# Patient Record
Sex: Female | Born: 1955 | Race: White | Hispanic: No | Marital: Married
Health system: Southern US, Community
[De-identification: ages and names within clinical notes are randomized; demographics above are authoritative.]

---

## 2010-04-25 ENCOUNTER — Ambulatory Visit
Admission: RE | Admit: 2010-04-25 | Discharge: 2010-04-25 | Payer: Self-pay | Source: Home / Self Care | Admitting: Gynecologic Oncology

## 2010-06-17 LAB — COMPREHENSIVE METABOLIC PANEL
BUN: 8 mg/dL (ref 6–23)
CO2: 26 mEq/L (ref 19–32)
Calcium: 9.6 mg/dL (ref 8.4–10.5)
Creatinine, Ser: 0.81 mg/dL (ref 0.4–1.2)
GFR calc non Af Amer: >60 mL/min (ref >60)
Glucose, Bld: 94 mg/dL (ref 70–99)
Total Protein: 8.4 g/dL — ABNORMAL HIGH (ref 6.0–8.3)

## 2010-06-17 LAB — DIFFERENTIAL
Basophils Absolute: 0.1 10*3/uL (ref 0.0–0.1)
Eosinophils Relative: 5 % (ref 0–5)
Lymphocytes Relative: 25 % (ref 12–46)
Monocytes Absolute: 0.5 10*3/uL (ref 0.1–1.0)
Monocytes Relative: 4 % (ref 3–12)
Neutro Abs: 8.1 10*3/uL — ABNORMAL HIGH (ref 1.7–7.7)

## 2010-06-17 LAB — CBC
HCT: 44.6 % (ref 36.0–46.0)
Hemoglobin: 14.8 g/dL (ref 12.0–15.0)
MCH: 27.4 pg (ref 26.0–34.0)
MCHC: 33.2 g/dL (ref 30.0–36.0)
RDW: 13.8 % (ref 11.5–15.5)

## 2010-06-17 LAB — SURGICAL PCR SCREEN: Staphylococcus aureus: POSITIVE — AB

## 2010-06-18 ENCOUNTER — Encounter: Payer: Self-pay | Admitting: Obstetrics & Gynecology

## 2010-06-18 ENCOUNTER — Ambulatory Visit (HOSPITAL_COMMUNITY)
Admission: RE | Admit: 2010-06-18 | Discharge: 2010-06-19 | Payer: Self-pay | Source: Home / Self Care | Attending: Obstetrics & Gynecology | Admitting: Obstetrics & Gynecology

## 2010-06-18 NOTE — Consult Note (Signed)
NAMEKRYSTI, [name] NO.:  [id number]  MEDICAL RECORD NO.:  [id number]           PATIENT TYPE:  LOCATION:                                 FACILITY:  PHYSICIAN:  Laurette Schimke, MD     DATE OF BIRTH:  [birth year]  DATE OF CONSULTATION:  06/18/2010 DATE OF DISCHARGE:                                CONSULTATION   REASON FOR SURGERY:  Endometrial cancer staging.  HISTORY OF PRESENT ILLNESS:  This is a 55 year old, para 1, with last normal menstrual period greater than 4 years ago.  Uterine bleeding was noted in August of 2011, and thus she presented with that complaint to her primary care practitioner on February 01, 2010.  She was ultimately evaluated by Dr. Senaida Ores.  An ultrasound demonstrated a homogeneous pattern with the uterine stripe of 15 mm.  Endometrial biopsy was consistent with a well-differentiated grade 1 endometrial cancer.  PAST MEDICAL HISTORY:  Low back pain for approximately 20 years.  PAST SURGICAL HISTORY:  Denies.  ALLERGIES:  No known drug allergies.  PAST GYNECOLOGICAL HISTORY:  Menarche at age 38 with regular menses until approximately 4 years ago.  No history of abnormal pap test.  SCREENING HISTORY:  Denies mammography or colonoscopy.  FAMILY HISTORY:  Father with bone cancer diagnosed in his 85s. Otherwise, no GYN or colon malignancies.  REVIEW OF SYSTEMS:  A 10-point review of systems is notable for easy fatigability with short distance of ambulation.  The patient becomes winded with climb of 1 flight of stairs.  There is no nausea associated with chest the pain.  The patient denies diaphoresis, radiation of pain. There has been no weight loss.  She denies low back pain.  She denies any changes in her bladder or rectal function.  MEDICATIONS:  Denies.  PHYSICAL EXAMINATION:  GENERAL APPEARANCE:  Well-developed female in no acute distress. HEENT:  Normocephalic, atraumatic.  Sclerae nonicteric.  Thinning of frontal  hair. LYMPH NODES:  No cervical, supraclavicular, or inguinal adenopathy. CHEST:  Clear to auscultation. BACK:  No CVA tenderness. EXTREMITIES:  No clubbing, cyanosis, or edema. HEART:  Regular rate and rhythm. ABDOMINOPELVIC:  Obese, soft.  No palpable omental masses.  No palpable pelvic masses, nontender.  No guarding.  Normal external genitalia, Bartholin's, urethral and Skene's glands.  No lesions.  No metastatic disease is noted within the vaginal vault or on the cervix. RECTAL:  Good anal sphincter without any masses.  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 The patient was counseled regarding the options and has opted for surgical staging.  Prior to surgical staging, medical clearance was requested by the Cardiopulmonary Services.  The patient was assessed by Dr. Baldo Daub.  The patient underwent intravenous Lexiscan per protocol.  There were nondiagnostic EKG changes without arrhythmias. The Cardiolite images did not reveal any evidence of ischemia.  The resting heart rate was 63 with blood pressure of 160/100.  There was normal ejection fraction.  The proposed procedure is a robotic-assisted laparoscopic hysterectomy, bilateral salpingo-oophorectomy and bilateral pelvic lymph node dissection as indicated by frozen section.  The risks  of this procedure were discussed with the patient, were inclusive of infection, bleeding, damage to surrounding structures, prolonged hospitalization and reoperation.  All of her questions were answered to her satisfaction.  The proposed procedure will occur on June 18, 2010.     Laurette Schimke, MD  CC Telford Nab, RN    Lester Cuyuna, MD    Roney Marion, MD   WB/MEDQ  D:  06/13/2010  T:  06/13/2010  Job:  [id number]   Electronically Signed by Laurette Schimke MD on 06/18/2010 11:11:47 AM

## 2010-06-19 LAB — CBC
HCT: 37 % (ref 36.0–46.0)
MCHC: 32.4 g/dL (ref 30.0–36.0)
Platelets: 242 10*3/uL (ref 150–400)
RDW: 13.9 % (ref 11.5–15.5)
WBC: 16.9 10*3/uL — ABNORMAL HIGH (ref 4.0–10.5)

## 2010-06-19 LAB — TYPE AND SCREEN
ABO/RH(D): O POS
Antibody Screen: NEGATIVE

## 2010-06-19 LAB — ABO/RH: ABO/RH(D): O POS

## 2010-06-20 NOTE — Op Note (Addendum)
NAMEANYE, BROSE                 ACCOUNT NO.:  [id number]  MEDICAL RECORD NO.:  [id number]          PATIENT TYPE:  AMB  LOCATION:  DAY                          FACILITY:  [facility]  PHYSICIAN:  Laurette Schimke, MD     DATE OF BIRTH:  1955-09-28  DATE OF PROCEDURE:  06/18/2010 DATE OF DISCHARGE:                              OPERATIVE REPORT   PREOPERATIVE DIAGNOSIS:  Grade 1 endometrial cancer.  POSTOPERATIVE DIAGNOSIS:  Stage 1A grade 1 endometrial adenocarcinoma.  PROCEDURE:  Robotic laparoscopic hysterectomy, bilateral salpingo- oophorectomy.  ANESTHESIA:  General endotracheal.  SURGEON:  Laurette Schimke, MD  ASSISTANT:  Antionette Char, MD and Telford Nab, RN  FINDINGS:  On examination of the abdomen, adnexa appeared within normal limits.  No evidence of metastatic disease was appreciated.  Uterus was approximately 8 cm.  DESCRIPTION OF PROCEDURE:  The patient was taken to the operating room, placed under general endotracheal anesthesia without any difficulty. She was prepped and draped and medium KOH ring placed over the cervix in addition to uterine manipulator, and then entry was made in the left upper quadrant 2 cm inferior to the ribcage and the abdomen insufflated. Trocars were placed just superior to the umbilicus and then 10 cm lateral to the umbilicus, ports were inserted and the robot docked.  The right round ligament was transected and retroperitoneal space was entered.  The ureter was identified.  Infundibulopelvic ligament was skeletonized, cauterized, and transected.  The bladder was dissected off the lower uterine segment and upper vagina.  The posterior peritoneum of the uterus was also reflected off.  The uterine artery was isolated, fulgurated and transected.  In the contralateral fashion, the filamentous adhesions of the left colon were dissected from the sidewall.  The left round ligament transected, retroperitoneal space entered, ureter  identified.  The infundibulopelvic ligament skeletonized.  Large venous sinus was appreciated on this side and care was taken to perform dissection around the structure.  The vessels were ligated and then transected.  The bladder flap was further dissected and posterior retroperitoneum dissected off the posterior aspect of the cervix.  The vaginal balloon was insufflated and the colpotomy was made. The specimen was then removed from the pelvis.  The abdomen was copiously irrigated and drained.  The vaginal cuff was closed with 0 Vicryl suture.  Hemostasis was assured.  Frozen section returned with evidence of minimal, if any, myometrial invasion and just small amount of residual disease.  Surgical sites were inspected.  Hemostasis was assured.  The ports were removed under direct visualization after removal of the suture.  The laparoscopic port sites were closed and subcutaneous tissue was approximated and skin closed with running subcuticular suture.  Sponges, instruments, and needle count correct x3.  SPECIMENS:  Washings, uterus, cervix, ovaries, and tubes.  DISPOSITION:  The patient was extubated and taken to recovery room in stable condition.     Laurette Schimke, MD     WB/MEDQ  D:  06/18/2010  T:  06/18/2010  Job:  [id number]  cc:   Roseanna Rainbow, M.D. Fax: [phone]  Telford Nab, R.N. [address] [facility]  [place], [place] [address]  Alison Murray Fax: [phone]  Roney Marion, MD [facility]  Electronically Signed by Laurette Schimke MD on 06/20/2010 11:23:02 AM

## 2010-08-08 ENCOUNTER — Ambulatory Visit: Payer: Self-pay | Attending: Gynecologic Oncology | Admitting: Gynecologic Oncology

## 2010-08-08 DIAGNOSIS — Z09 Encounter for follow-up examination after completed treatment for conditions other than malignant neoplasm: Secondary | ICD-10-CM | POA: Insufficient documentation

## 2010-08-08 DIAGNOSIS — C549 Malignant neoplasm of corpus uteri, unspecified: Secondary | ICD-10-CM | POA: Insufficient documentation

## 2010-08-16 NOTE — Consult Note (Signed)
  [name], [name]                 ACCOUNT NO.:  [id number]  MEDICAL RECORD NO.:  [id number]           PATIENT TYPE:  LOCATION:                                 FACILITY:  PHYSICIAN:  Laurette Schimke, MD     DATE OF BIRTH:  Mar 17, 1956  DATE OF CONSULTATION:  08/08/2010 DATE OF DISCHARGE:                                CONSULTATION   REASON FOR VISIT:  Postoperative visit status post endometrial cancer staging.  HISTORY OF PRESENT ILLNESS:  This is a 55 year old who had episodes of uterine bleeding dating back to August 2011.  Dr. Senaida Ores began evaluation in October 2011, at which time pelvic ultrasound was notable for a uterine stripe of 15 mm and an endometrial biopsy was performed and consistent with a mucinous adenocarcinoma of the endometrium, well- differentiated, grade 1.  On June 18 2010, she underwent robotic hysterectomy and bilateral salpingo-oophorectomy.  Frozen section diagnosis was consistent with a grade 1 endometrial cancer with minimal myometrial invasion.  Final pathology was notable for myometrial invasion of 2 mm.  There was no lymphovascular space invasion and no evidence of metastatic disease.  As such, she has a stage IA grade 1 endometrioid adenocarcinoma.  Ms. Province states that she has been doing well and is anxious to return to her pool exercises.  There is no nausea, vomiting, fever, chills, diarrhea, vaginal discharge or vaginal bleeding.  PHYSICAL EXAMINATION:  GENERAL:  A well-developed female in no acute distress. VITAL SIGNS:  Weight 192 pounds, blood pressure 122/74, pulse was 68. CHEST:  Clear to auscultation. HEART:  Regular rate and rhythm. ABDOMEN:  Soft, nontender.  No evidence of hernia at the port sites.  No erythema. PELVIC EXAMINATION:  Normal external genitalia, Bartholin's, urethra and Skene's.  Vaginal cuff intact but the vagina is markedly atrophic.  No cul-de-sac masses or fluid collections were palpable.  IMPRESSION:   Stage IA grade 1 endometrioid endometrial adenocarcinoma.  PLAN:  I have asked Ms. Heinzman to follow up with Dr. Senaida Ores in 6 months and follow up with Gynecology/Oncology service in 1 year.  The routine will be a visual inspection of the vaginal vault with bimanual examinations every 6 months.  Imaging or other biomarker survey will only be indicated based on symptomatology.  She had questions regarding returning to function and returning to her usual activities and her husband's questions were answered to their satisfaction.     Laurette Schimke, MD     WB/MEDQ  D:  08/08/2010  T:  08/08/2010  Job:  [id number]  cc:   Alison Murray Fax: [phone]  Telford Nab, R.N. [phone] [address] [place], [place] [address]  Roney Marion, M.D. Rosalita Levan, [place]  Electronically Signed by Laurette Schimke MD on 08/12/2010 10:35:21 AM

## 2012-05-21 IMAGING — CR DG CHEST 2V
2 series · 2 of 2 positions shown · non-contrast
Comparison: None.

CLINICAL DATA: Preoperative film for patient endometrial carcinoma.

CHEST - 2 VIEW

[w chest pa]
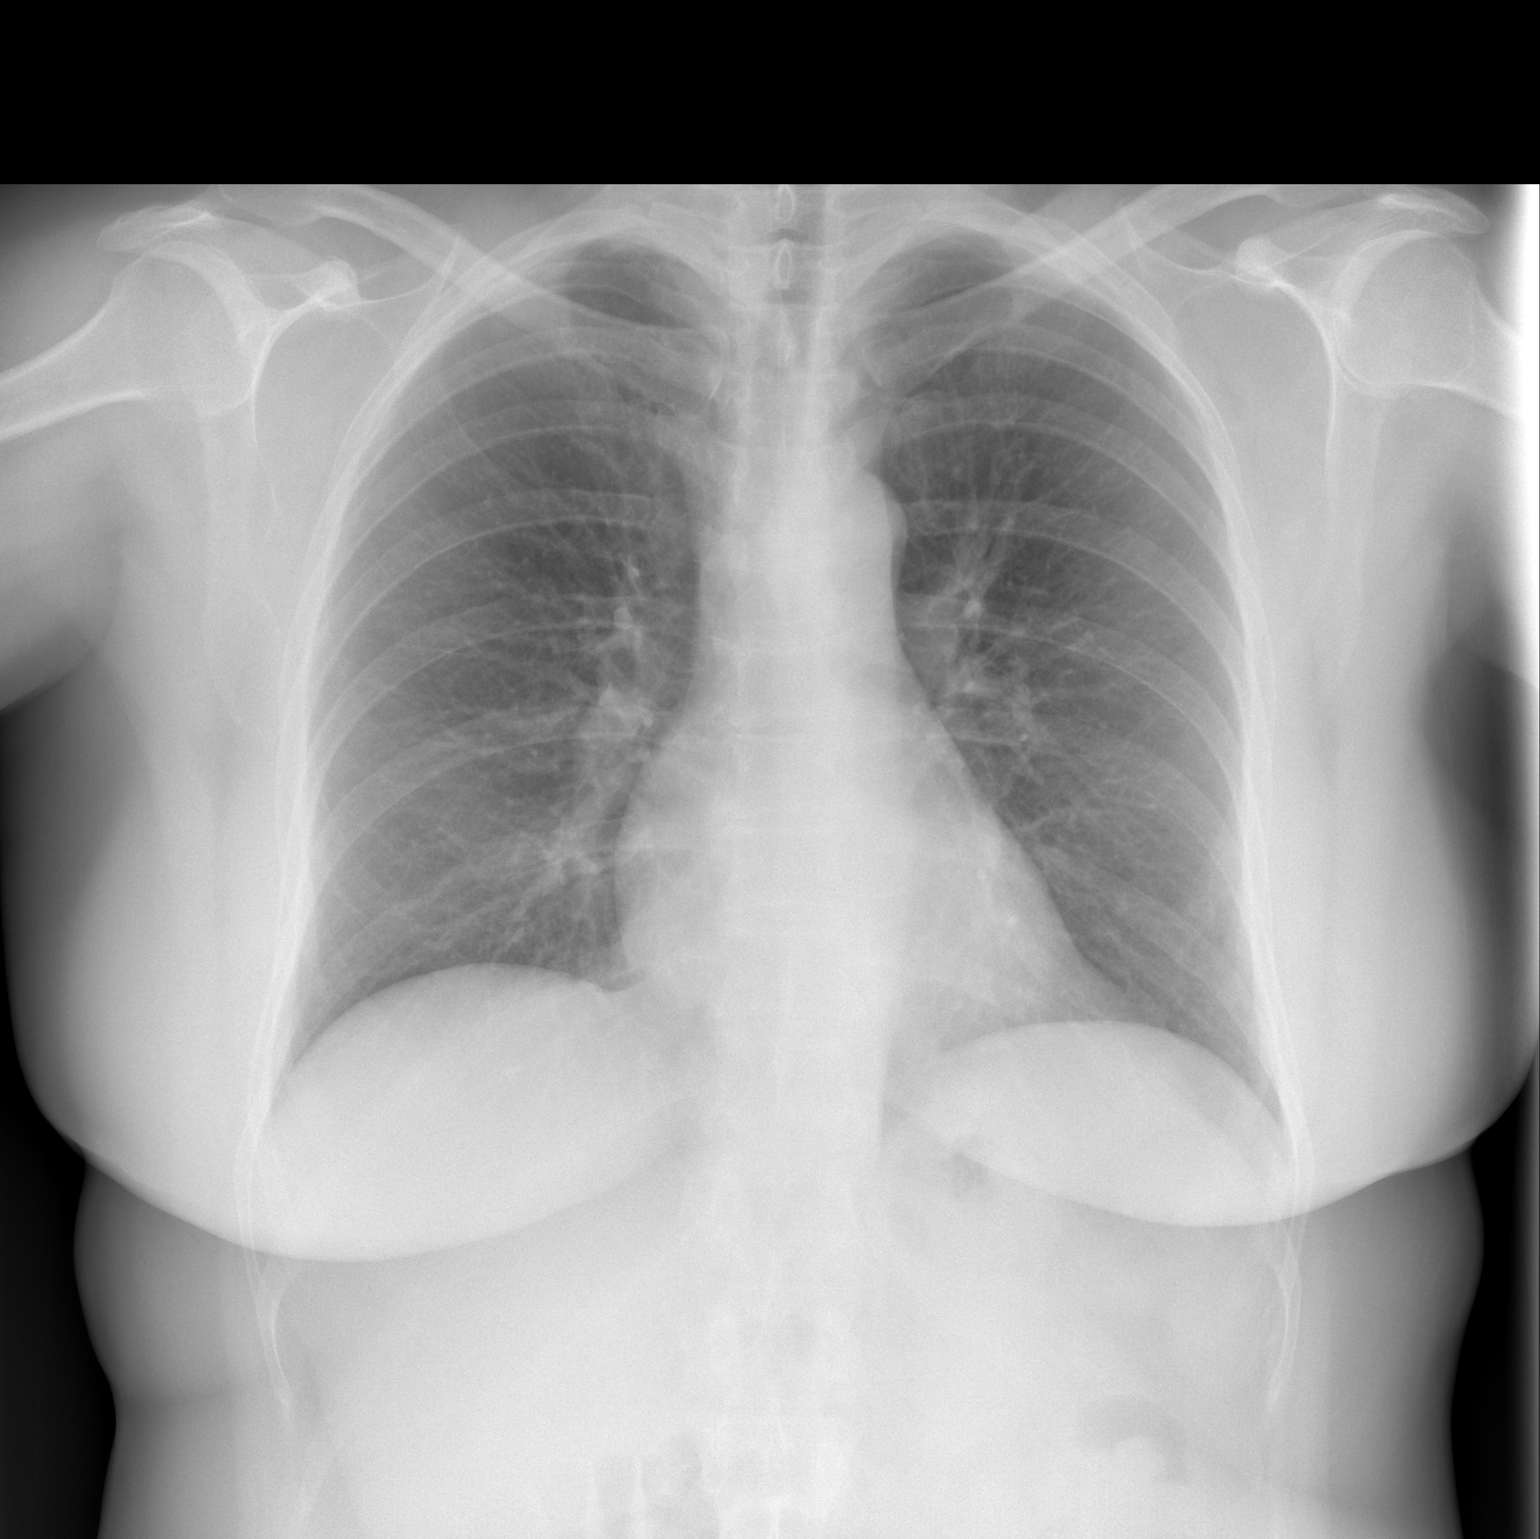

[w chest lat]
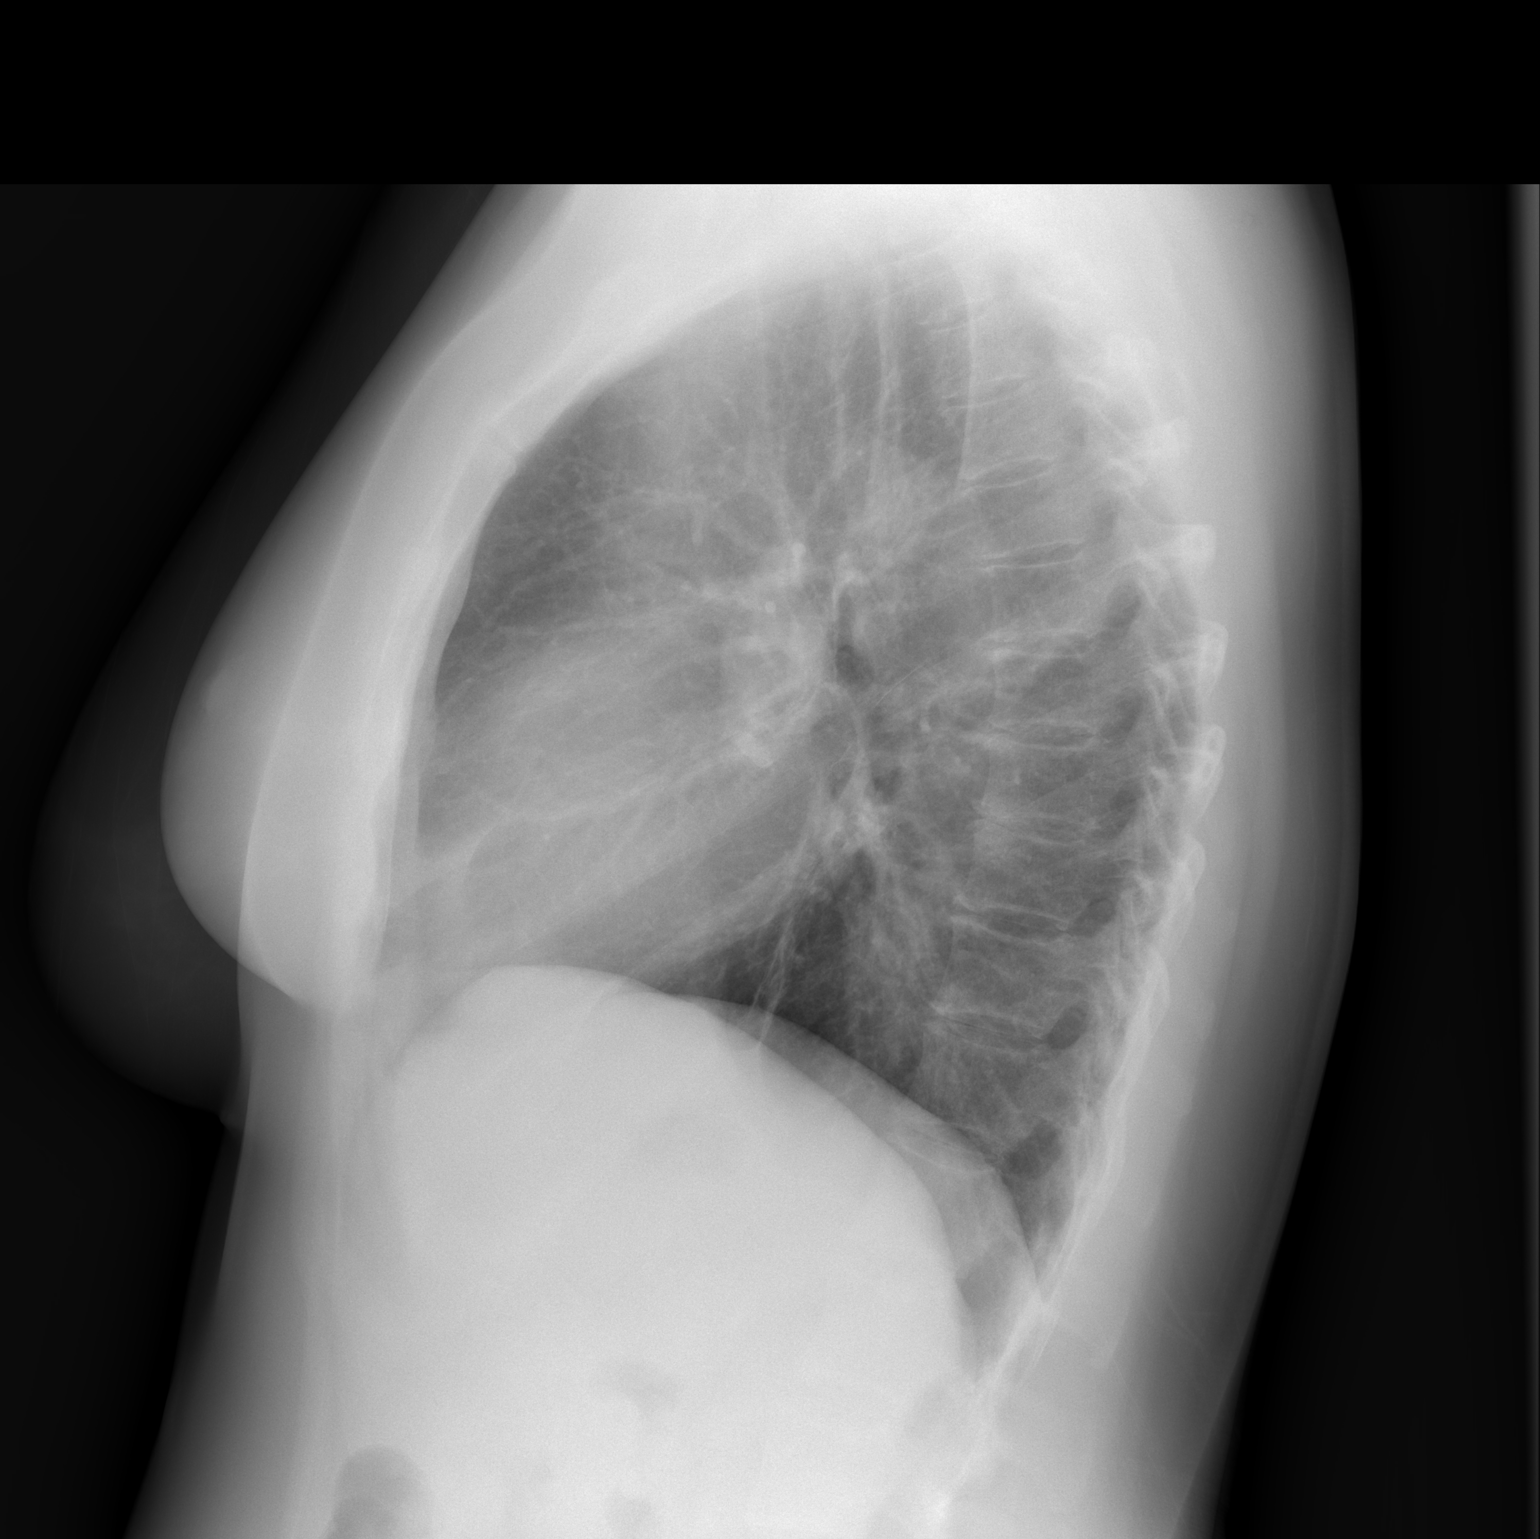

[2 of 2 positions shown; findings below may reference images not displayed]

FINDINGS: Lungs are clear.  No pneumothorax or pleural effusion.
Heart size is normal.  No focal bony abnormality.
IMPRESSION: Negative chest.

## 2018-09-01 DIAGNOSIS — Z0000 Encounter for general adult medical examination without abnormal findings: Secondary | ICD-10-CM | POA: Diagnosis not present

## 2018-09-13 DIAGNOSIS — Z1212 Encounter for screening for malignant neoplasm of rectum: Secondary | ICD-10-CM | POA: Diagnosis not present

## 2018-09-13 DIAGNOSIS — Z1211 Encounter for screening for malignant neoplasm of colon: Secondary | ICD-10-CM | POA: Diagnosis not present

## 2018-09-16 DIAGNOSIS — Z79899 Other long term (current) drug therapy: Secondary | ICD-10-CM | POA: Diagnosis not present

## 2018-09-16 DIAGNOSIS — H6120 Impacted cerumen, unspecified ear: Secondary | ICD-10-CM | POA: Diagnosis not present

## 2018-09-16 DIAGNOSIS — R300 Dysuria: Secondary | ICD-10-CM | POA: Diagnosis not present

## 2018-09-16 DIAGNOSIS — Z6832 Body mass index (BMI) 32.0-32.9, adult: Secondary | ICD-10-CM | POA: Diagnosis not present

## 2018-09-16 DIAGNOSIS — I10 Essential (primary) hypertension: Secondary | ICD-10-CM | POA: Diagnosis not present

## 2018-09-24 DIAGNOSIS — Z780 Asymptomatic menopausal state: Secondary | ICD-10-CM | POA: Diagnosis not present

## 2018-09-24 DIAGNOSIS — M810 Age-related osteoporosis without current pathological fracture: Secondary | ICD-10-CM | POA: Diagnosis not present

## 2018-11-18 DIAGNOSIS — Z0100 Encounter for examination of eyes and vision without abnormal findings: Secondary | ICD-10-CM | POA: Diagnosis not present

## 2018-11-18 DIAGNOSIS — H524 Presbyopia: Secondary | ICD-10-CM | POA: Diagnosis not present

## 2020-02-13 DIAGNOSIS — Z6832 Body mass index (BMI) 32.0-32.9, adult: Secondary | ICD-10-CM | POA: Diagnosis not present

## 2020-02-13 DIAGNOSIS — Z79899 Other long term (current) drug therapy: Secondary | ICD-10-CM | POA: Diagnosis not present

## 2020-02-13 DIAGNOSIS — Z6836 Body mass index (BMI) 36.0-36.9, adult: Secondary | ICD-10-CM | POA: Diagnosis not present

## 2020-02-13 DIAGNOSIS — Z0000 Encounter for general adult medical examination without abnormal findings: Secondary | ICD-10-CM | POA: Diagnosis not present

## 2020-02-13 DIAGNOSIS — Z23 Encounter for immunization: Secondary | ICD-10-CM | POA: Diagnosis not present

## 2020-02-13 DIAGNOSIS — I10 Essential (primary) hypertension: Secondary | ICD-10-CM | POA: Diagnosis not present

## 2020-02-13 DIAGNOSIS — E785 Hyperlipidemia, unspecified: Secondary | ICD-10-CM | POA: Diagnosis not present

## 2020-02-13 DIAGNOSIS — E782 Mixed hyperlipidemia: Secondary | ICD-10-CM | POA: Diagnosis not present

## 2020-02-13 DIAGNOSIS — Z1331 Encounter for screening for depression: Secondary | ICD-10-CM | POA: Diagnosis not present

## 2020-02-21 ENCOUNTER — Ambulatory Visit (INDEPENDENT_AMBULATORY_CARE_PROVIDER_SITE_OTHER): Payer: Medicare HMO | Admitting: Sports Medicine

## 2020-02-21 ENCOUNTER — Other Ambulatory Visit: Payer: Self-pay | Admitting: Sports Medicine

## 2020-02-21 ENCOUNTER — Other Ambulatory Visit: Payer: Self-pay

## 2020-02-21 ENCOUNTER — Ambulatory Visit (INDEPENDENT_AMBULATORY_CARE_PROVIDER_SITE_OTHER): Payer: Medicare HMO

## 2020-02-21 ENCOUNTER — Encounter: Payer: Self-pay | Admitting: Sports Medicine

## 2020-02-21 DIAGNOSIS — M79671 Pain in right foot: Secondary | ICD-10-CM | POA: Diagnosis not present

## 2020-02-21 DIAGNOSIS — M216X2 Other acquired deformities of left foot: Secondary | ICD-10-CM | POA: Diagnosis not present

## 2020-02-21 DIAGNOSIS — M216X1 Other acquired deformities of right foot: Secondary | ICD-10-CM | POA: Diagnosis not present

## 2020-02-21 DIAGNOSIS — M79672 Pain in left foot: Secondary | ICD-10-CM | POA: Diagnosis not present

## 2020-02-21 DIAGNOSIS — M722 Plantar fascial fibromatosis: Secondary | ICD-10-CM

## 2020-02-21 DIAGNOSIS — M779 Enthesopathy, unspecified: Secondary | ICD-10-CM

## 2020-02-21 MED ORDER — MELOXICAM 15 MG PO TABS: 15.0000 mg | ORAL_TABLET | Freq: Every day | ORAL | 0 refills | Status: AC

## 2020-02-21 NOTE — Progress Notes (Signed)
Subjective: [name] is a 64 y.o. female patient presents to office with complaint of moderate heel pain on the left>right. Patient admits to post static dyskinesia for 2 months. Patient has treated this problem with motrin 200mg  and brace with no relief. Denies any other pedal complaints.   Review of Systems  All other systems reviewed and are negative.    There are no problems to display for this patient.   Current Outpatient Medications on File Prior to Visit  Medication Sig Dispense Refill  . Ibuprofen (MOTRIN PO) Take by mouth.     No current facility-administered medications on file prior to visit.    No Known Allergies  Objective: Physical Exam General: The patient is alert and oriented x3 in no acute distress.  Dermatology: Skin is warm, dry and supple bilateral lower extremities. Nails 1-10 are normal. There is no erythema, edema, no eccymosis, no open lesions present. Integument is otherwise unremarkable.  Vascular: Dorsalis Pedis pulse and Posterior Tibial pulse are 1/4 bilateral. Capillary fill time is immediate to all digits.  Neurological: Grossly intact to light touch  bilateral.  Musculoskeletal: Tenderness to palpation at the medial calcaneal tubercale and through the insertion of the plantar fascia on the left>right foot. No pain with compression of calcaneus bilateral. No pain with tuning fork to calcaneus bilateral. No pain with calf compression bilateral. There is decreased Ankle joint range of motion bilateral. All other joints range of motion within normal limits bilateral. Strength 5/5 in all groups bilateral.   Gait: Unassisted, Antalgic avoid weight on heels  Xray, Left foot:  Normal osseous mineralization. Joint spaces preserved. No fracture/dislocation/boney destruction. Calcaneal spur present with mild thickening of plantar fascia. No other soft tissue abnormalities or radiopaque foreign bodies.   Assessment and Plan: Problem List Items Addressed  This Visit    None    Visit Diagnoses    Plantar fasciitis of left foot    -  Primary   Relevant Orders   DG Foot Complete Left   Plantar fasciitis of right foot       Left foot pain       Right foot pain       Acquired equinus deformity of both feet          -Complete examination performed.  -Xrays reviewed -Discussed with patient in detail the condition of plantar fasciitis, how this occurs and general treatment options. Explained both conservative and surgical treatments.  -Patient declined PO steroid at this visit  -Rx Meloxicam -Recommended good supportive shoes and advised refrain from walking barefoot -Continue with current OTC brace -Explained and dispensed to patient daily stretching exercises. -Recommend patient to ice affected area 1-2x daily. -Patient to return to office in 3 weeks for follow up or sooner if problems or questions arise.  Landis Martins, DPM

## 2020-02-21 NOTE — Patient Instructions (Signed)
Stretch every morning as below Do not walk around bare foot Ice each heel with frozen water bottle for 7mins a day in the evening   Plantar Fasciitis Rehab Ask your health care provider which exercises are safe for you. Do exercises exactly as told by your health care provider and adjust them as directed. It is normal to feel mild stretching, pulling, tightness, or discomfort as you do these exercises. Stop right away if you feel sudden pain or your pain gets worse. Do not begin these exercises until told by your health care provider. Stretching and range-of-motion exercises These exercises warm up your muscles and joints and improve the movement and flexibility of your foot. These exercises also help to relieve pain. Plantar fascia stretch  1. Sit with your left / right leg crossed over your opposite knee. 2. Hold your heel with one hand with that thumb near your arch. With your other hand, hold your toes and gently pull them back toward the top of your foot. You should feel a stretch on the bottom of your toes or your foot (plantar fascia) or both. 3. Hold this stretch for 5 econds. 4. Slowly release your toes and return to the starting position. Repeat ___30____ times. Complete this exercise ____once______ times a day. Gastrocnemius stretch, standing This exercise is also called a calf (gastroc) stretch. It stretches the muscles in the back of the upper calf. 1. Stand with your hands against a wall. 2. Extend your left / right leg behind you, and bend your front knee slightly. 3. Keeping your heels on the floor and your back knee straight, shift your weight toward the wall. Do not arch your back. You should feel a gentle stretch in your upper left / right calf. 4. Hold this position for __________ seconds. Repeat __________ times. Complete this exercise __________ times a day. Soleus stretch, standing This exercise is also called a calf (soleus) stretch. It stretches the muscles in the  back of the lower calf. 1. Stand with your hands against a wall. 2. Extend your left / right leg behind you, and bend your front knee slightly. 3. Keeping your heels on the floor, bend your back knee and shift your weight slightly over your back leg. You should feel a gentle stretch deep in your lower calf. 4. Hold this position for __________ seconds. Repeat __________ times. Complete this exercise __________ times a day. Gastroc and soleus stretch, standing step This exercise stretches the muscles in the back of the lower leg. These muscles are in the upper calf (gastrocnemius) and the lower calf (soleus). 1. Stand with the ball of your left / right foot on a step. The ball of your foot is on the walking surface, right under your toes. 2. Keep your other foot firmly on the same step. 3. Hold on to the wall or a railing for balance. 4. Slowly lift your other foot, allowing your body weight to press your left / right heel down over the edge of the step. You should feel a stretch in your left / right calf. 5. Hold this position for __________ seconds. 6. Return both feet to the step. 7. Repeat this exercise with a slight bend in your left / right knee. Repeat __________ times with your left / right knee straight and __________ times with your left / right knee bent. Complete this exercise __________ times a day. Balance exercise This exercise builds your balance and strength control of your arch to help take pressure  off your plantar fascia. Single leg stand If this exercise is too easy, you can try it with your eyes closed or while standing on a pillow. 1. Without shoes, stand near a railing or in a doorway. You may hold on to the railing or door frame as needed. 2. Stand on your left / right foot. Keep your big toe down on the floor and try to keep your arch lifted. Do not let your foot roll inward. 3. Hold this position for __________ seconds. Repeat __________ times. Complete this exercise  __________ times a day. This information is not intended to replace advice given to you by your health care provider. Make sure you discuss any questions you have with your health care provider. Document Revised: 09/02/2018 Document Reviewed: 03/10/2018 Elsevier Patient Education  [place].

## 2020-03-01 DIAGNOSIS — Z0100 Encounter for examination of eyes and vision without abnormal findings: Secondary | ICD-10-CM | POA: Diagnosis not present

## 2020-03-06 DIAGNOSIS — H524 Presbyopia: Secondary | ICD-10-CM | POA: Diagnosis not present

## 2020-03-06 DIAGNOSIS — Z0100 Encounter for examination of eyes and vision without abnormal findings: Secondary | ICD-10-CM | POA: Diagnosis not present

## 2020-03-13 ENCOUNTER — Ambulatory Visit (INDEPENDENT_AMBULATORY_CARE_PROVIDER_SITE_OTHER): Payer: Medicare HMO | Admitting: Sports Medicine

## 2020-03-13 ENCOUNTER — Other Ambulatory Visit: Payer: Self-pay

## 2020-03-13 ENCOUNTER — Encounter: Payer: Self-pay | Admitting: Sports Medicine

## 2020-03-13 DIAGNOSIS — M216X2 Other acquired deformities of left foot: Secondary | ICD-10-CM

## 2020-03-13 DIAGNOSIS — M722 Plantar fascial fibromatosis: Secondary | ICD-10-CM

## 2020-03-13 DIAGNOSIS — M79672 Pain in left foot: Secondary | ICD-10-CM | POA: Diagnosis not present

## 2020-03-13 DIAGNOSIS — M216X1 Other acquired deformities of right foot: Secondary | ICD-10-CM

## 2020-03-13 DIAGNOSIS — M79671 Pain in right foot: Secondary | ICD-10-CM

## 2020-03-13 MED ORDER — DICLOFENAC SODIUM 1 % EX GEL: 4.0000 g | Freq: Four times a day (QID) | CUTANEOUS | 1 refills | Status: AC

## 2020-03-13 NOTE — Patient Instructions (Addendum)
You may start using Voltaren cream/gel on next week if you still have foot pain once you have finished Mobic (pills by mouth)  For tennis shoes recommend:  [place] Can be purchased at [employer] or [employer] arch fit Can be purchased at any major retailers  Vionic  SAS Can be purchased at [employer] or [employer]   For work shoes recommend: [employer] Work [employer] Can be purchased at a variety of places or Engineer, maintenance (IT)   For casual shoes recommend: Vionic  Can be purchased at [employer] or [place] recommend: Power Steps Can be purchased in office/[employer] and [place] Can be purchased at [employer] or [employer] Can be purchased at [employer]

## 2020-03-13 NOTE — Progress Notes (Signed)
Subjective: [name] is a 64 y.o. female returns to office for follow up evaluation of heel pain right.  Patient reports her heels are feeling better had a little bit of twitching in her toes on the right for one episode when she started the meloxicam but otherwise is doing much better.  Patient reports that icing and gentle stretching helps but had to stop stretching when she crosses her legs because it was aggravating her knee.  Patient denies any other pedal complaints at this time.  There are no problems to display for this patient.   Current Outpatient Medications on File Prior to Visit  Medication Sig Dispense Refill  . Ibuprofen (MOTRIN PO) Take by mouth.    . meloxicam (MOBIC) 15 MG tablet Take 1 tablet (15 mg total) by mouth daily. 30 tablet 0   No current facility-administered medications on file prior to visit.    No Known Allergies  Objective:   General:  Alert and oriented x 3, in no acute distress  Dermatology: Skin is warm, dry, and supple bilateral. Nails are within normal limits. There is no lower extremity erythema, no eccymosis, no open lesions present bilateral.   Vascular: Dorsalis Pedis and Posterior Tibial pedal pulses are 1/4 bilateral. + hair growth noted bilateral. Capillary Fill Time is 3 seconds in all digits. No varicosities, No edema bilateral lower extremities.   Neurological: Sensation grossly intact to light touch bilateral.  Musculoskeletal: There is decreased tenderness to palpation at the medial calcaneal tubercale and through the insertion of the plantar fascia on the left foot.  No pain with compression to calcaneus or application of tuning fork. There is decreased Ankle joint range of motion bilateral. All other jointsrange of motion  within normal limits bilateral. Strength 5/5 bilateral.   Assessment and Plan: Problem List Items Addressed This Visit    None    Visit Diagnoses    Plantar fasciitis of left foot    -  Primary   Plantar  fasciitis of right foot       Left foot pain       Right foot pain       Acquired equinus deformity of both feet          -Complete examination performed.  -Previous x-rays reviewed. -Re-Discussed with patient in detail the condition of plantar fasciitis, how this occurs related to the foot type of the patient and general treatment options. - Patient declines steroid at this time -Prescribed Voltaren gel to use as instructed -Continue with meloxicam until completed -Continue with stretching, icing, good supportive shoes, and heel lifts as dispensed daily  -Discussed long term care and reocurrence; will closely monitor; if fails to improve will consider other treatment modalities.  -Patient to call if symptoms fail to improve after 1 month.  Landis Martins, DPM

## 2021-02-15 DIAGNOSIS — M8580 Other specified disorders of bone density and structure, unspecified site: Secondary | ICD-10-CM | POA: Diagnosis not present

## 2021-02-15 DIAGNOSIS — Z0000 Encounter for general adult medical examination without abnormal findings: Secondary | ICD-10-CM | POA: Diagnosis not present

## 2021-02-15 DIAGNOSIS — Z23 Encounter for immunization: Secondary | ICD-10-CM | POA: Diagnosis not present

## 2021-02-15 DIAGNOSIS — Z6835 Body mass index (BMI) 35.0-35.9, adult: Secondary | ICD-10-CM | POA: Diagnosis not present

## 2021-02-15 DIAGNOSIS — Z2821 Immunization not carried out because of patient refusal: Secondary | ICD-10-CM | POA: Diagnosis not present

## 2021-02-15 DIAGNOSIS — E785 Hyperlipidemia, unspecified: Secondary | ICD-10-CM | POA: Diagnosis not present

## 2021-02-15 DIAGNOSIS — Z1331 Encounter for screening for depression: Secondary | ICD-10-CM | POA: Diagnosis not present

## 2021-03-14 DIAGNOSIS — Z0100 Encounter for examination of eyes and vision without abnormal findings: Secondary | ICD-10-CM | POA: Diagnosis not present

## 2021-03-14 DIAGNOSIS — H524 Presbyopia: Secondary | ICD-10-CM | POA: Diagnosis not present

## 2022-02-07 DIAGNOSIS — M8580 Other specified disorders of bone density and structure, unspecified site: Secondary | ICD-10-CM | POA: Diagnosis not present

## 2022-02-07 DIAGNOSIS — E785 Hyperlipidemia, unspecified: Secondary | ICD-10-CM | POA: Diagnosis not present

## 2022-02-17 DIAGNOSIS — Z0000 Encounter for general adult medical examination without abnormal findings: Secondary | ICD-10-CM | POA: Diagnosis not present

## 2022-02-17 DIAGNOSIS — E785 Hyperlipidemia, unspecified: Secondary | ICD-10-CM | POA: Diagnosis not present

## 2022-02-17 DIAGNOSIS — M8580 Other specified disorders of bone density and structure, unspecified site: Secondary | ICD-10-CM | POA: Diagnosis not present

## 2022-02-25 DIAGNOSIS — Z1211 Encounter for screening for malignant neoplasm of colon: Secondary | ICD-10-CM | POA: Diagnosis not present

## 2022-02-25 DIAGNOSIS — Z1212 Encounter for screening for malignant neoplasm of rectum: Secondary | ICD-10-CM | POA: Diagnosis not present

## 2022-03-03 LAB — COLOGUARD: COLOGUARD: POSITIVE — AB

## 2022-03-25 DIAGNOSIS — H524 Presbyopia: Secondary | ICD-10-CM | POA: Diagnosis not present

## 2023-02-23 DIAGNOSIS — E785 Hyperlipidemia, unspecified: Secondary | ICD-10-CM | POA: Diagnosis not present

## 2023-02-23 DIAGNOSIS — E559 Vitamin D deficiency, unspecified: Secondary | ICD-10-CM | POA: Diagnosis not present

## 2023-02-23 DIAGNOSIS — H6120 Impacted cerumen, unspecified ear: Secondary | ICD-10-CM | POA: Diagnosis not present

## 2023-02-27 DIAGNOSIS — Z0000 Encounter for general adult medical examination without abnormal findings: Secondary | ICD-10-CM | POA: Diagnosis not present

## 2023-02-27 DIAGNOSIS — E559 Vitamin D deficiency, unspecified: Secondary | ICD-10-CM | POA: Diagnosis not present

## 2023-02-27 DIAGNOSIS — Z2821 Immunization not carried out because of patient refusal: Secondary | ICD-10-CM | POA: Diagnosis not present

## 2023-02-27 DIAGNOSIS — Z9181 History of falling: Secondary | ICD-10-CM | POA: Diagnosis not present

## 2023-02-27 DIAGNOSIS — E785 Hyperlipidemia, unspecified: Secondary | ICD-10-CM | POA: Diagnosis not present

## 2023-05-12 DIAGNOSIS — H25813 Combined forms of age-related cataract, bilateral: Secondary | ICD-10-CM | POA: Diagnosis not present

## 2023-07-17 DIAGNOSIS — H43812 Vitreous degeneration, left eye: Secondary | ICD-10-CM | POA: Diagnosis not present

## 2023-09-15 DIAGNOSIS — H43812 Vitreous degeneration, left eye: Secondary | ICD-10-CM | POA: Diagnosis not present

## 2024-03-01 DIAGNOSIS — E559 Vitamin D deficiency, unspecified: Secondary | ICD-10-CM | POA: Diagnosis not present

## 2024-03-01 DIAGNOSIS — E785 Hyperlipidemia, unspecified: Secondary | ICD-10-CM | POA: Diagnosis not present

## 2024-03-01 DIAGNOSIS — H6120 Impacted cerumen, unspecified ear: Secondary | ICD-10-CM | POA: Diagnosis not present

## 2024-03-07 DIAGNOSIS — Z23 Encounter for immunization: Secondary | ICD-10-CM | POA: Diagnosis not present

## 2024-03-07 DIAGNOSIS — Z0000 Encounter for general adult medical examination without abnormal findings: Secondary | ICD-10-CM | POA: Diagnosis not present

## 2024-03-07 DIAGNOSIS — Z2821 Immunization not carried out because of patient refusal: Secondary | ICD-10-CM | POA: Diagnosis not present

## 2024-03-07 DIAGNOSIS — Z9181 History of falling: Secondary | ICD-10-CM | POA: Diagnosis not present
